# Patient Record
Sex: Male | Born: 1970 | Race: Black or African American | Hispanic: No | Marital: Single | Smoking: Never smoker
Health system: Southern US, Community
[De-identification: ages and names within clinical notes are randomized; demographics above are authoritative.]

---

## 1998-06-04 HISTORY — PX: ABDOMINAL SURGERY: SHX537

## 1998-07-15 ENCOUNTER — Inpatient Hospital Stay (HOSPITAL_COMMUNITY): Admission: EM | Admit: 1998-07-15 | Discharge: 1998-07-21 | Payer: Self-pay | Admitting: Emergency Medicine

## 1998-07-16 ENCOUNTER — Encounter: Payer: Self-pay | Admitting: General Surgery

## 1998-07-18 ENCOUNTER — Encounter: Payer: Self-pay | Admitting: General Surgery

## 1999-01-26 ENCOUNTER — Encounter: Admission: RE | Admit: 1999-01-26 | Discharge: 1999-04-26 | Payer: Self-pay | Admitting: Neurological Surgery

## 2002-06-04 HISTORY — PX: SHOULDER OPEN ROTATOR CUFF REPAIR: SHX2407

## 2003-04-14 ENCOUNTER — Ambulatory Visit (HOSPITAL_COMMUNITY): Admission: RE | Admit: 2003-04-14 | Discharge: 2003-04-14 | Payer: Self-pay | Admitting: Orthopedic Surgery

## 2003-04-15 ENCOUNTER — Ambulatory Visit (HOSPITAL_COMMUNITY): Admission: RE | Admit: 2003-04-15 | Discharge: 2003-04-15 | Payer: Self-pay | Admitting: Orthopedic Surgery

## 2010-06-24 ENCOUNTER — Encounter: Payer: Self-pay | Admitting: Orthopedic Surgery

## 2012-04-12 ENCOUNTER — Emergency Department (HOSPITAL_COMMUNITY)
Admission: EM | Admit: 2012-04-12 | Discharge: 2012-04-12 | Disposition: A | Discharge status: Home / Self Care | Payer: Self-pay | Attending: Emergency Medicine | Admitting: Emergency Medicine

## 2012-04-12 ENCOUNTER — Encounter (HOSPITAL_COMMUNITY): Payer: Self-pay | Admitting: Emergency Medicine

## 2012-04-12 ENCOUNTER — Emergency Department (HOSPITAL_COMMUNITY): Payer: Self-pay

## 2012-04-12 DIAGNOSIS — S0993XA Unspecified injury of face, initial encounter: Secondary | ICD-10-CM

## 2012-04-12 DIAGNOSIS — S02609A Fracture of mandible, unspecified, initial encounter for closed fracture: Secondary | ICD-10-CM

## 2012-04-12 DIAGNOSIS — S0180XA Unspecified open wound of other part of head, initial encounter: Secondary | ICD-10-CM | POA: Insufficient documentation

## 2012-04-12 DIAGNOSIS — S0181XA Laceration without foreign body of other part of head, initial encounter: Secondary | ICD-10-CM

## 2012-04-12 DIAGNOSIS — S025XXA Fracture of tooth (traumatic), initial encounter for closed fracture: Secondary | ICD-10-CM | POA: Insufficient documentation

## 2012-04-12 DIAGNOSIS — R51 Headache: Secondary | ICD-10-CM | POA: Insufficient documentation

## 2012-04-12 LAB — BASIC METABOLIC PANEL
BUN: 9 mg/dL (ref 6–23)
CO2: 24 mEq/L (ref 19–32)
Calcium: 8.8 mg/dL (ref 8.4–10.5)
Chloride: 100 mEq/L (ref 96–112)
Creatinine, Ser: 0.66 mg/dL (ref 0.50–1.35)
Glucose, Bld: 72 mg/dL (ref 70–99)

## 2012-04-12 LAB — CBC
HCT: 38 % — ABNORMAL LOW (ref 39.0–52.0)
MCH: 31.1 pg (ref 26.0–34.0)
MCV: 92.5 fL (ref 78.0–100.0)
RBC: 4.11 MIL/uL — ABNORMAL LOW (ref 4.22–5.81)
WBC: 13.3 10*3/uL — ABNORMAL HIGH (ref 4.0–10.5)

## 2012-04-12 MED ORDER — OXYCODONE-ACETAMINOPHEN 5-325 MG PO TABS: 1.0000 | ORAL_TABLET | ORAL | Status: DC | PRN

## 2012-04-12 MED ORDER — SODIUM CHLORIDE 0.9 % IV SOLN
1000.0000 mL | INTRAVENOUS | Status: DC
  Administered 2012-04-12: 1000 mL via INTRAVENOUS

## 2012-04-12 MED ORDER — IBUPROFEN 600 MG PO TABS: 600.0000 mg | ORAL_TABLET | Freq: Three times a day (TID) | ORAL | Status: DC | PRN

## 2012-04-12 MED ORDER — SODIUM CHLORIDE 0.9 % IV SOLN
1000.0000 mL | Freq: Once | INTRAVENOUS | Status: AC
  Administered 2012-04-12: 1000 mL via INTRAVENOUS

## 2012-04-12 MED ORDER — HYDROMORPHONE HCL PF 1 MG/ML IJ SOLN
1.0000 mg | Freq: Once | INTRAMUSCULAR | Status: AC
  Administered 2012-04-12: 1 mg via INTRAVENOUS
  Filled 2012-04-12: qty 1
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
## 2012-05-07 ENCOUNTER — Encounter (HOSPITAL_COMMUNITY): Payer: Self-pay | Admitting: *Deleted

## 2012-05-07 ENCOUNTER — Emergency Department (INDEPENDENT_AMBULATORY_CARE_PROVIDER_SITE_OTHER)
Admission: EM | Admit: 2012-05-07 | Discharge: 2012-05-07 | Disposition: A | Discharge status: Home / Self Care | Payer: Self-pay | Source: Home / Self Care | Attending: Family Medicine | Admitting: Family Medicine

## 2012-05-07 DIAGNOSIS — Z4802 Encounter for removal of sutures: Secondary | ICD-10-CM

## 2012-05-07 NOTE — ED Notes (Signed)
Here for recheck of laceration forehead 11/8- for suture removal
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
## 2012-05-10 NOTE — ED Provider Notes (Signed)
Medical screening examination/treatment/procedure(s) were performed by resident physician or non-physician practitioner and as supervising physician I was immediately available for consultation/collaboration.   Aquinnah Devin DOUGLAS MD.    Elinora Weigand D Deerica Waszak, MD 05/10/12 1933 

## 2012-06-10 ENCOUNTER — Emergency Department (HOSPITAL_COMMUNITY)
Admission: EM | Admit: 2012-06-10 | Discharge: 2012-06-10 | Disposition: A | Discharge status: Home / Self Care | Payer: Self-pay | Attending: Emergency Medicine | Admitting: Emergency Medicine

## 2012-06-10 ENCOUNTER — Encounter (HOSPITAL_COMMUNITY): Payer: Self-pay

## 2012-06-10 DIAGNOSIS — R5381 Other malaise: Secondary | ICD-10-CM | POA: Insufficient documentation

## 2012-06-10 DIAGNOSIS — Z87828 Personal history of other (healed) physical injury and trauma: Secondary | ICD-10-CM | POA: Insufficient documentation

## 2012-06-10 DIAGNOSIS — J3489 Other specified disorders of nose and nasal sinuses: Secondary | ICD-10-CM | POA: Insufficient documentation

## 2012-06-10 DIAGNOSIS — J111 Influenza due to unidentified influenza virus with other respiratory manifestations: Secondary | ICD-10-CM | POA: Insufficient documentation

## 2012-06-10 DIAGNOSIS — R42 Dizziness and giddiness: Secondary | ICD-10-CM | POA: Insufficient documentation

## 2012-06-10 DIAGNOSIS — B9789 Other viral agents as the cause of diseases classified elsewhere: Secondary | ICD-10-CM | POA: Insufficient documentation

## 2012-06-10 DIAGNOSIS — Z9889 Other specified postprocedural states: Secondary | ICD-10-CM | POA: Insufficient documentation

## 2012-06-10 DIAGNOSIS — R197 Diarrhea, unspecified: Secondary | ICD-10-CM | POA: Insufficient documentation

## 2012-06-10 DIAGNOSIS — B349 Viral infection, unspecified: Secondary | ICD-10-CM

## 2012-06-10 NOTE — ED Provider Notes (Signed)
Medical screening examination/treatment/procedure(s) were performed by non-physician practitioner and as supervising physician I was immediately available for consultation/collaboration.  Gerhard Munch, MD 06/10/12 2350
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
## 2012-06-10 NOTE — ED Notes (Signed)
Pt with c/o of nausea, diarrhea, weakness and irritated tonsils since early this am

## 2017-06-06 ENCOUNTER — Encounter (HOSPITAL_COMMUNITY): Payer: Self-pay | Admitting: Emergency Medicine

## 2017-06-06 ENCOUNTER — Ambulatory Visit (INDEPENDENT_AMBULATORY_CARE_PROVIDER_SITE_OTHER): Payer: Self-pay

## 2017-06-06 ENCOUNTER — Ambulatory Visit (HOSPITAL_COMMUNITY)
Admission: EM | Admit: 2017-06-06 | Discharge: 2017-06-06 | Disposition: A | Discharge status: Home / Self Care | Payer: Self-pay | Attending: Internal Medicine | Admitting: Internal Medicine

## 2017-06-06 DIAGNOSIS — M25531 Pain in right wrist: Secondary | ICD-10-CM

## 2017-06-06 DIAGNOSIS — S62114A Nondisplaced fracture of triquetrum [cuneiform] bone, right wrist, initial encounter for closed fracture: Secondary | ICD-10-CM

## 2017-06-06 MED ORDER — HYDROCODONE-ACETAMINOPHEN 5-325 MG PO TABS
2.0000 | ORAL_TABLET | ORAL | 0 refills | Status: AC | PRN
Start: 2017-06-06 — End: ?

## 2017-06-06 NOTE — Progress Notes (Signed)
Orthopedic Tech Progress Note Patient Details:  Kirk Sanders August 31, 1970 [id number]  Ortho Devices Type of Ortho Device: Volar splint Ortho Device/Splint Location: rue Ortho Device/Splint Interventions: Ordered, Application, Adjustment   Post Interventions Patient Tolerated: Well Instructions Provided: Care of device   [name], [name] J 06/06/2017, 8:32 PM

## 2017-06-06 NOTE — ED Triage Notes (Signed)
PT reports he fell backwards and attempted to catch himself with his right hand.

## 2017-06-06 NOTE — ED Notes (Signed)
Ortho tech returned page 
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
## 2017-06-06 NOTE — ED Notes (Signed)
Ortho Tech paged.

## 2017-06-24 ENCOUNTER — Encounter (HOSPITAL_COMMUNITY): Payer: Self-pay | Admitting: Emergency Medicine

## 2017-06-24 ENCOUNTER — Other Ambulatory Visit: Payer: Self-pay

## 2017-06-24 ENCOUNTER — Ambulatory Visit (HOSPITAL_COMMUNITY)
Admission: EM | Admit: 2017-06-24 | Discharge: 2017-06-24 | Disposition: A | Discharge status: Home / Self Care | Payer: Self-pay | Attending: Family Medicine | Admitting: Family Medicine

## 2017-06-24 DIAGNOSIS — S62114D Nondisplaced fracture of triquetrum [cuneiform] bone, right wrist, subsequent encounter for fracture with routine healing: Secondary | ICD-10-CM

## 2017-06-24 NOTE — Discharge Instructions (Signed)
Please follow up with Ortho in 2-3 weeks for cast removal and to recheck that wrist is continuing to heal properly. Call today to get set up so that appointment lines up appropriately.
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
## 2017-06-24 NOTE — Progress Notes (Signed)
Orthopedic Tech Progress Note Patient Details:  Kirk Sanders September 20, 1970 [id number]  Casting Type of Cast: Short arm cast Cast Location: Right Wrist Cast applied on right arm.  pt tolerated application very well.  Cast Material: Fiberglass Cast Intervention: Application        Alvina ChouWilliams, [name] 06/24/2017, 1:51 PM

## 2017-06-24 NOTE — ED Triage Notes (Signed)
Pt states he needs a hard cast on his R wrist. Pt did not follow up with ortho.

## 2017-06-24 NOTE — ED Notes (Signed)
Paged ortho 

## 2019-09-12 IMAGING — DX DG WRIST COMPLETE 3+V*R*
4 series · 4 of 4 positions shown · non-contrast
Comparison: None.

CLINICAL DATA: Pain after fall

EXAM:
RIGHT WRIST - COMPLETE 3+ VIEW

[wrist pa]
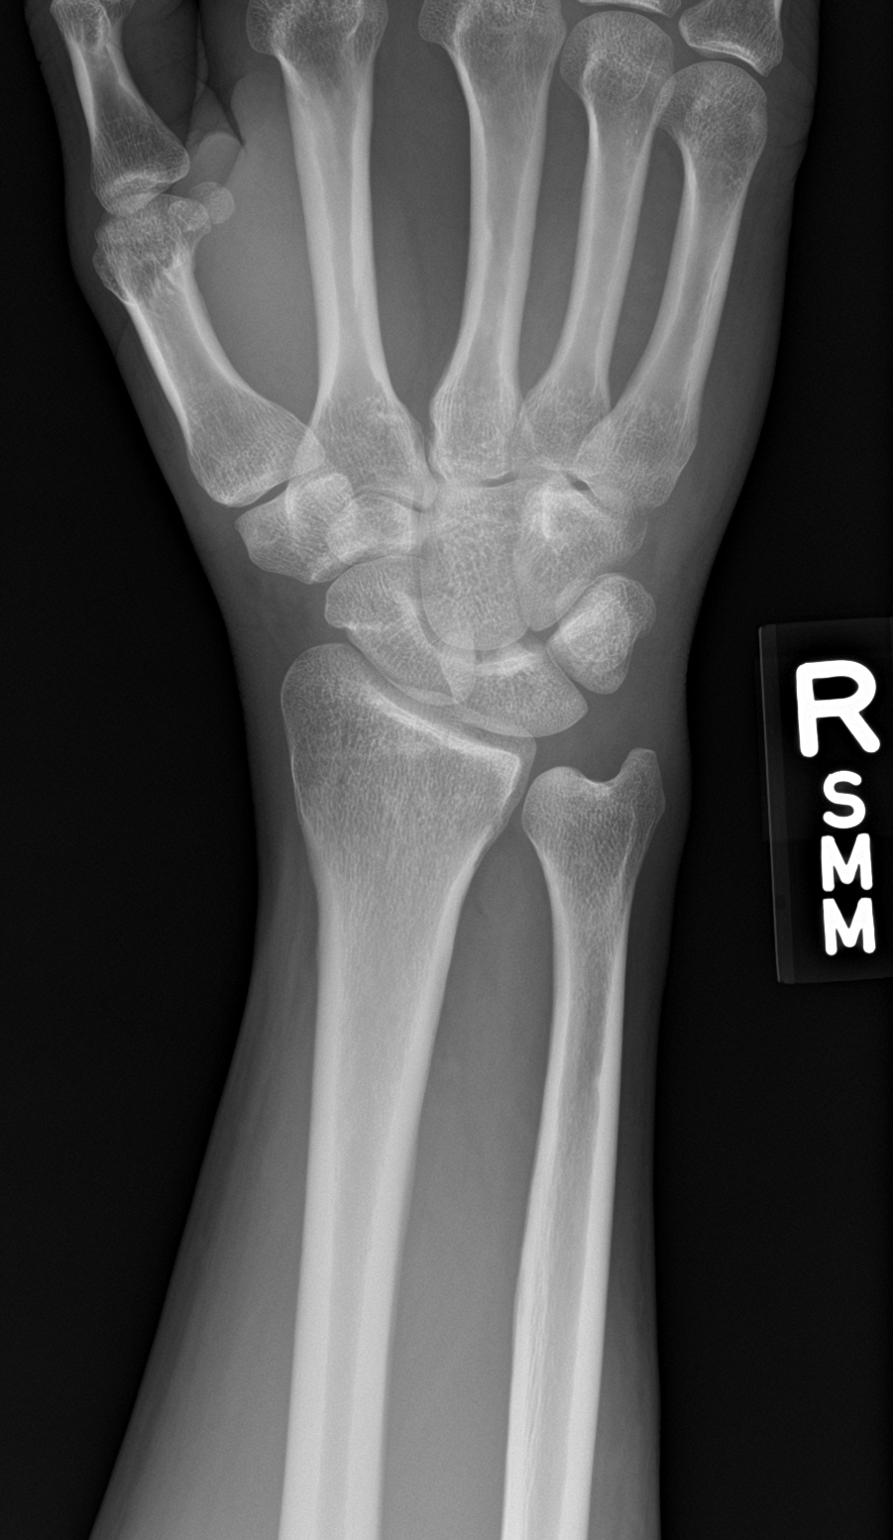

[wrist navicular]
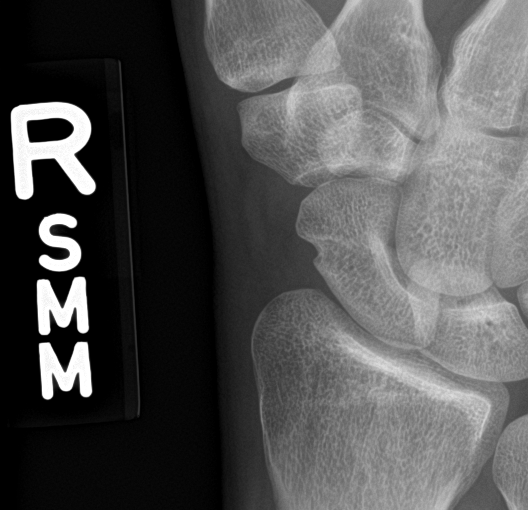

[wrist obl]
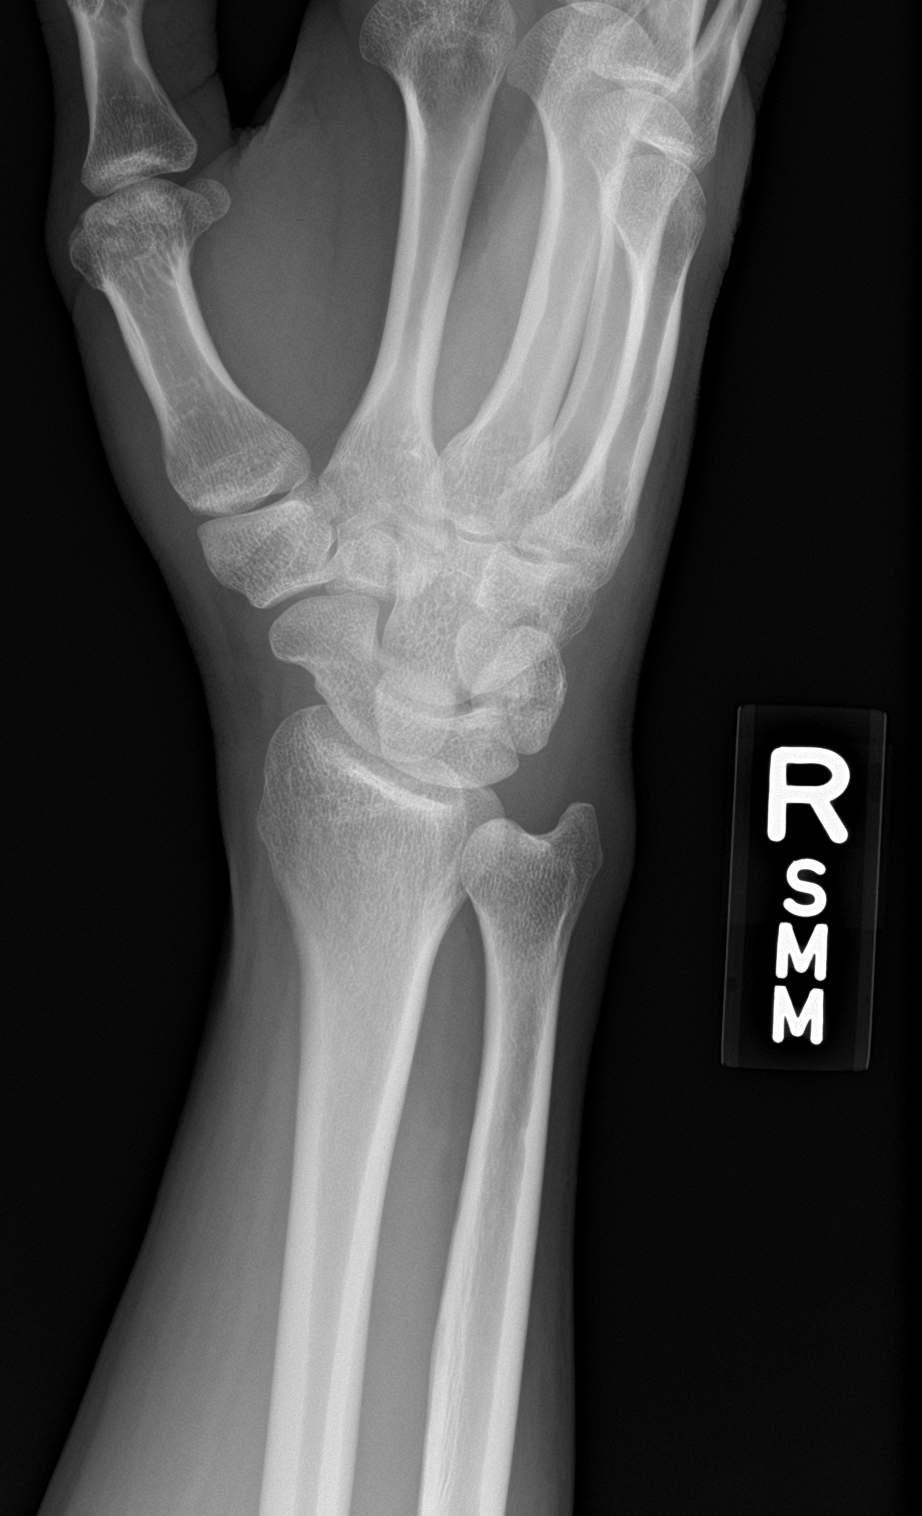

[wrist lat]
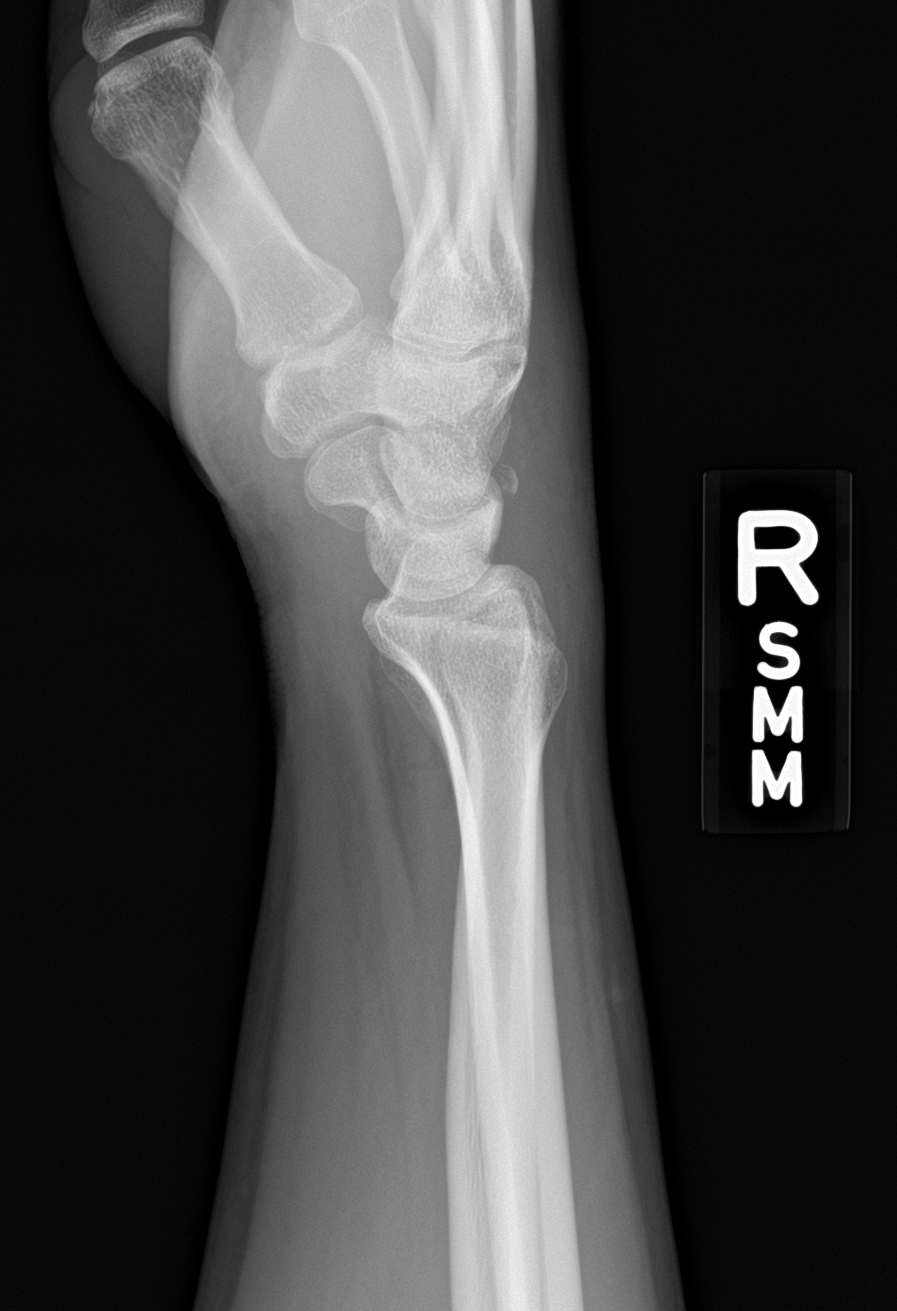

[4 of 4 positions shown; findings below may reference images not displayed]

FINDINGS: There is dorsal soft tissue swelling. There is a calcification/ bone
fragment located along the dorsal aspect of the wrist which is most
consistent with a triquetrum fracture. No dislocation or other
fracture identified.
IMPRESSION: The findings are most consistent with a triquetrum fracture best
seen on the lateral view.
